# Patient Record
Sex: Female | Born: 1995 | Race: Black or African American | Hispanic: No | Marital: Single | Smoking: Never smoker
Health system: Southern US, Community
[De-identification: ages and names within clinical notes are randomized; demographics above are authoritative.]

---
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
## 2019-06-27 ENCOUNTER — Emergency Department (HOSPITAL_BASED_OUTPATIENT_CLINIC_OR_DEPARTMENT_OTHER)
Admission: EM | Admit: 2019-06-27 | Discharge: 2019-06-27 | Disposition: A | Discharge status: Home / Self Care | Payer: Self-pay | Attending: Emergency Medicine | Admitting: Emergency Medicine

## 2019-06-27 ENCOUNTER — Encounter (HOSPITAL_BASED_OUTPATIENT_CLINIC_OR_DEPARTMENT_OTHER): Payer: Self-pay | Admitting: Emergency Medicine

## 2019-06-27 ENCOUNTER — Emergency Department (HOSPITAL_BASED_OUTPATIENT_CLINIC_OR_DEPARTMENT_OTHER): Payer: Self-pay

## 2019-06-27 ENCOUNTER — Other Ambulatory Visit: Payer: Self-pay

## 2019-06-27 DIAGNOSIS — N390 Urinary tract infection, site not specified: Secondary | ICD-10-CM | POA: Insufficient documentation

## 2019-06-27 DIAGNOSIS — U071 COVID-19: Secondary | ICD-10-CM | POA: Insufficient documentation

## 2019-06-27 DIAGNOSIS — R1012 Left upper quadrant pain: Secondary | ICD-10-CM | POA: Insufficient documentation

## 2019-06-27 DIAGNOSIS — R109 Unspecified abdominal pain: Secondary | ICD-10-CM

## 2019-06-27 LAB — URINALYSIS, ROUTINE W REFLEX MICROSCOPIC
Bilirubin Urine: NEGATIVE
Glucose, UA: NEGATIVE mg/dL
Ketones, ur: 15 mg/dL — AB
Nitrite: NEGATIVE
Protein, ur: NEGATIVE mg/dL
Specific Gravity, Urine: >1.03 — ABNORMAL HIGH (ref 1.005–1.030)
pH: 6 (ref 5.0–8.0)

## 2019-06-27 LAB — CBC WITH DIFFERENTIAL/PLATELET
Abs Immature Granulocytes: 0.01 10*3/uL (ref 0.00–0.07)
Basophils Absolute: 0 10*3/uL (ref 0.0–0.1)
Basophils Relative: 0 %
Eosinophils Absolute: 0 10*3/uL (ref 0.0–0.5)
Eosinophils Relative: 0 %
HCT: 45.3 % (ref 36.0–46.0)
Hemoglobin: 13.8 g/dL (ref 12.0–15.0)
Immature Granulocytes: 0 %
Lymphocytes Relative: 31 %
Lymphs Abs: 2.2 10*3/uL (ref 0.7–4.0)
MCH: 22.4 pg — ABNORMAL LOW (ref 26.0–34.0)
MCHC: 30.5 g/dL (ref 30.0–36.0)
MCV: 73.5 fL — ABNORMAL LOW (ref 80.0–100.0)
Monocytes Absolute: 0.5 10*3/uL (ref 0.1–1.0)
Monocytes Relative: 6 %
Neutro Abs: 4.4 10*3/uL (ref 1.7–7.7)
Neutrophils Relative %: 63 %
Platelets: 307 10*3/uL (ref 150–400)
RBC: 6.16 MIL/uL — ABNORMAL HIGH (ref 3.87–5.11)
RDW: 14.6 % (ref 11.5–15.5)
WBC: 7 10*3/uL (ref 4.0–10.5)
nRBC: 0 % (ref 0.0–0.2)

## 2019-06-27 LAB — URINALYSIS, MICROSCOPIC (REFLEX)

## 2019-06-27 LAB — COMPREHENSIVE METABOLIC PANEL
ALT: 11 U/L (ref 0–44)
AST: 12 U/L — ABNORMAL LOW (ref 15–41)
Albumin: 3.8 g/dL (ref 3.5–5.0)
Alkaline Phosphatase: 63 U/L (ref 38–126)
Anion gap: 9 (ref 5–15)
BUN: 11 mg/dL (ref 6–20)
CO2: 25 mmol/L (ref 22–32)
Calcium: 9.1 mg/dL (ref 8.9–10.3)
Chloride: 105 mmol/L (ref 98–111)
Creatinine, Ser: 0.78 mg/dL (ref 0.44–1.00)
GFR calc Af Amer: >60 mL/min (ref >60)
GFR calc non Af Amer: >60 mL/min (ref >60)
Glucose, Bld: 110 mg/dL — ABNORMAL HIGH (ref 70–99)
Potassium: 3.6 mmol/L (ref 3.5–5.1)
Sodium: 139 mmol/L (ref 135–145)
Total Bilirubin: 0.3 mg/dL (ref 0.3–1.2)
Total Protein: 7.9 g/dL (ref 6.5–8.1)

## 2019-06-27 LAB — PREGNANCY, URINE: Preg Test, Ur: NEGATIVE

## 2019-06-27 MED ORDER — NITROFURANTOIN MONOHYD MACRO 100 MG PO CAPS
100.0000 mg | ORAL_CAPSULE | Freq: Once | ORAL | Status: AC
  Administered 2019-06-27: 100 mg via ORAL
  Filled 2019-06-27: qty 1

## 2019-06-27 MED ORDER — ONDANSETRON 8 MG PO TBDP
8.0000 mg | ORAL_TABLET | Freq: Once | ORAL | Status: AC
  Administered 2019-06-27: 8 mg via ORAL
  Filled 2019-06-27: qty 1

## 2019-06-27 MED ORDER — LIDOCAINE VISCOUS HCL 2 % MT SOLN
15.0000 mL | Freq: Once | OROMUCOSAL | Status: AC
  Administered 2019-06-27: 03:00:00 15 mL via ORAL
  Filled 2019-06-27: qty 15

## 2019-06-27 MED ORDER — ALUM & MAG HYDROXIDE-SIMETH 200-200-20 MG/5ML PO SUSP
30.0000 mL | Freq: Once | ORAL | Status: AC
  Administered 2019-06-27: 30 mL via ORAL
  Filled 2019-06-27: qty 30

## 2019-06-27 MED ORDER — ACETAMINOPHEN 500 MG PO TABS
1000.0000 mg | ORAL_TABLET | Freq: Once | ORAL | Status: AC
  Administered 2019-06-27: 03:00:00 1000 mg via ORAL
  Filled 2019-06-27: qty 2

## 2019-06-27 MED ORDER — NITROFURANTOIN MONOHYD MACRO 100 MG PO CAPS: 100.0000 mg | ORAL_CAPSULE | Freq: Two times a day (BID) | ORAL | 0 refills | Status: AC

## 2019-06-27 NOTE — Discharge Instructions (Addendum)
Person Under Monitoring Name: Jenna Hall  Location: [address] [place] [address]   Infection Prevention Recommendations for Individuals Confirmed to have, or Being Evaluated for, 2019 Novel Coronavirus (COVID-19) Infection Who Receive Care at Home  Individuals who are confirmed to have, or are being evaluated for, COVID-19 should follow the prevention steps below until a healthcare provider or local or state health department says they can return to normal activities.  Stay home except to get medical care You should restrict activities outside your home, except for getting medical care. Do not go to work, school, or public areas, and do not use public transportation or taxis.  Call ahead before visiting your doctor Before your medical appointment, call the healthcare provider and tell them that you have, or are being evaluated for, COVID-19 infection. This will help the healthcare providers office take steps to keep other people from getting infected. Ask your healthcare provider to call the local or state health department.  Monitor your symptoms Seek prompt medical attention if your illness is worsening (e.g., difficulty breathing). Before going to your medical appointment, call the healthcare provider and tell them that you have, or are being evaluated for, COVID-19 infection. Ask your healthcare provider to call the local or state health department.  Wear a facemask You should wear a facemask that covers your nose and mouth when you are in the same room with other people and when you visit a healthcare provider. People who live with or visit you should also wear a facemask while they are in the same room with you.  Separate yourself from other people in your home As much as possible, you should stay in a different room from other people in your home. Also, you should use a separate bathroom, if available.  Avoid sharing household items You  should not share dishes, drinking glasses, cups, eating utensils, towels, bedding, or other items with other people in your home. After using these items, you should wash them thoroughly with soap and water.  Cover your coughs and sneezes Cover your mouth and nose with a tissue when you cough or sneeze, or you can cough or sneeze into your sleeve. Throw used tissues in a lined trash can, and immediately wash your hands with soap and water for at least 20 seconds or use an alcohol-based hand rub.  Wash your [facility] your hands often and thoroughly with soap and water for at least 20 seconds. You can use an alcohol-based hand sanitizer if soap and water are not available and if your hands are not visibly dirty. Avoid touching your eyes, nose, and mouth with unwashed hands.   Prevention Steps for Caregivers and Household Members of Individuals Confirmed to have, or Being Evaluated for, COVID-19 Infection Being Cared for in the Home  If you live with, or provide care at home for, a person confirmed to have, or being evaluated for, COVID-19 infection please follow these guidelines to prevent infection:  Follow healthcare providers instructions Make sure that you understand and can help the patient follow any healthcare provider instructions for all care.  Provide for the patients basic needs You should help the patient with basic needs in the home and provide support for getting groceries, prescriptions, and other personal needs.  Monitor the patients symptoms If they are getting sicker, call his or her medical provider and tell them that the patient has, or is being evaluated for, COVID-19 infection. This will help the healthcare  providers office take steps to keep other people from getting infected. Ask the healthcare provider to call the local or state health department.  Limit the number of people who have contact with the patient If possible, have only one caregiver for the  patient. Other household members should stay in another home or place of residence. If this is not possible, they should stay in another room, or be separated from the patient as much as possible. Use a separate bathroom, if available. Restrict visitors who do not have an essential need to be in the home.  Keep older adults, very young children, and other sick people away from the patient Keep older adults, very young children, and those who have compromised immune systems or chronic health conditions away from the patient. This includes people with chronic heart, lung, or kidney conditions, diabetes, and cancer.  Ensure good ventilation Make sure that shared spaces in the home have good air flow, such as from an air conditioner or an opened window, weather permitting.  Wash your hands often Wash your hands often and thoroughly with soap and water for at least 20 seconds. You can use an alcohol based hand sanitizer if soap and water are not available and if your hands are not visibly dirty. Avoid touching your eyes, nose, and mouth with unwashed hands. Use disposable paper towels to dry your hands. If not available, use dedicated cloth towels and replace them when they become wet.  Wear a facemask and gloves Wear a disposable facemask at all times in the room and gloves when you touch or have contact with the patients blood, body fluids, and/or secretions or excretions, such as sweat, saliva, sputum, nasal mucus, vomit, urine, or feces.  Ensure the mask fits over your nose and mouth tightly, and do not touch it during use. Throw out disposable facemasks and gloves after using them. Do not reuse. Wash your hands immediately after removing your facemask and gloves. If your personal clothing becomes contaminated, carefully remove clothing and launder. Wash your hands after handling contaminated clothing. Place all used disposable facemasks, gloves, and other waste in a lined container before  disposing them with other household waste. Remove gloves and wash your hands immediately after handling these items.  Do not share dishes, glasses, or other household items with the patient Avoid sharing household items. You should not share dishes, drinking glasses, cups, eating utensils, towels, bedding, or other items with a patient who is confirmed to have, or being evaluated for, COVID-19 infection. After the person uses these items, you should wash them thoroughly with soap and water.  Wash laundry thoroughly Immediately remove and wash clothes or bedding that have blood, body fluids, and/or secretions or excretions, such as sweat, saliva, sputum, nasal mucus, vomit, urine, or feces, on them. Wear gloves when handling laundry from the patient. Read and follow directions on labels of laundry or clothing items and detergent. In general, wash and dry with the warmest temperatures recommended on the label.  Clean all areas the individual has used often Clean all touchable surfaces, such as counters, tabletops, doorknobs, bathroom fixtures, toilets, phones, keyboards, tablets, and bedside tables, every day. Also, clean any surfaces that may have blood, body fluids, and/or secretions or excretions on them. Wear gloves when cleaning surfaces the patient has come in contact with. Use a diluted bleach solution (e.g., dilute bleach with 1 part bleach and 10 parts water) or a household disinfectant with a label that says EPA-registered for coronaviruses. To make  a bleach solution at home, add 1 tablespoon of bleach to 1 quart (4 cups) of water. For a larger supply, add  cup of bleach to 1 gallon (16 cups) of water. Read labels of cleaning products and follow recommendations provided on product labels. Labels contain instructions for safe and effective use of the cleaning product including precautions you should take when applying the product, such as wearing gloves or eye protection and making sure you  have good ventilation during use of the product. Remove gloves and wash hands immediately after cleaning.  Monitor yourself for signs and symptoms of illness Caregivers and household members are considered close contacts, should monitor their health, and will be asked to limit movement outside of the home to the extent possible. Follow the monitoring steps for close contacts listed on the symptom monitoring form.   ? If you have additional questions, contact your local health department or call the epidemiologist on call at [phone] (available 24/7). ? This guidance is subject to change. For the most up-to-date guidance from [facility], please refer to their website: [email or web address]

## 2019-06-27 NOTE — ED Provider Notes (Addendum)
[facility] EMERGENCY DEPARTMENT Provider Note   CSN: [id number] Arrival date & time: 06/27/19  0053     History   Chief Complaint Chief Complaint  Patient presents with  . Abdominal Pain    HPI Jenna Hall is a 23 y.o. female.     The history is provided by the patient.  Abdominal Pain Pain location:  LUQ and RUQ Pain quality: aching   Pain radiates to:  Does not radiate Pain severity:  Moderate Onset quality:  Gradual Duration:  1 week Timing:  Constant Progression:  Unchanged Chronicity:  New Context: sick contacts   Context comment:  Has been coughing as she has covid and has had the pain since being diagnosed Relieved by:  Nothing Worsened by:  Nothing Ineffective treatments:  None tried Associated symptoms: cough   Associated symptoms: no anorexia, no belching, no chest pain, no chills, no constipation, no diarrhea, no dysuria, no fatigue, no fever, no flatus, no hematemesis, no hematochezia, no hematuria, no melena, no nausea, no shortness of breath, no sore throat, no vaginal bleeding, no vaginal discharge and no vomiting   Risk factors: not pregnant and no recent hospitalization   Patient diagnosed with covid a week ago and has been having upper abdominal pain since that time. Is somewhat positional. No f/c/r. No n/v/d.  No urinary symptoms.  No anosmia.  Is eating and drinking normally.    History reviewed. No pertinent past medical history.  There are no active problems to display for this patient.   History reviewed. No pertinent surgical history.   OB History   No obstetric history on file.      Home Medications    Prior to Admission medications   Medication Sig Start Date End Date Taking? Authorizing Provider  Cetirizine HCl (ZYRTEC ALLERGY) 10 MG CAPS Zyrtec    [provider]    Family History No family history on file.  Social History Social History   Tobacco Use  . Smoking status: Never Smoker  . Smokeless  tobacco: Never Used  Substance Use Topics  . Alcohol use: Yes  . Drug use: Never     Allergies   Patient has no known allergies.   Review of Systems Review of Systems  Constitutional: Negative for chills, fatigue and fever.  HENT: Negative for sore throat.   Eyes: Negative for visual disturbance.  Respiratory: Positive for cough. Negative for chest tightness, shortness of breath and wheezing.   Cardiovascular: Negative for chest pain, palpitations and leg swelling.  Gastrointestinal: Positive for abdominal pain. Negative for abdominal distention, anorexia, blood in stool, constipation, diarrhea, flatus, hematemesis, hematochezia, melena, nausea and vomiting.  Genitourinary: Negative for dysuria, flank pain, hematuria, vaginal bleeding and vaginal discharge.  Musculoskeletal: Negative for arthralgias.  Skin: Negative for rash.  Neurological: Negative for weakness and numbness.  Psychiatric/Behavioral: Negative for agitation.  All other systems reviewed and are negative.    Physical Exam Updated Vital Signs BP (!) 130/98   Pulse (!) 101   Temp 98.3 F (36.8 C) (Oral)   Resp 16   Ht 5\' 4"  (1.626 m)   Wt 131.5 kg   LMP 06/21/2019 (Exact Date)   SpO2 99%   BMI 49.78 kg/m   Physical Exam Vitals signs and nursing note reviewed.  Constitutional:      General: She is not in acute distress.    Appearance: She is obese. She is not ill-appearing.  HENT:     Head: Normocephalic and atraumatic.  Nose: Nose normal.  Eyes:     Conjunctiva/sclera: Conjunctivae normal.     Pupils: Pupils are equal, round, and reactive to light.  Neck:     Musculoskeletal: Normal range of motion and neck supple.  Cardiovascular:     Rate and Rhythm: Normal rate and regular rhythm.     Pulses: Normal pulses.     Heart sounds: Normal heart sounds.  Pulmonary:     Effort: Pulmonary effort is normal. No respiratory distress.     Breath sounds: Normal breath sounds. No wheezing or rales.   Abdominal:     General: Abdomen is flat. Bowel sounds are normal. There is no distension.     Palpations: Abdomen is soft.     Tenderness: There is no abdominal tenderness. There is no guarding or rebound. Negative signs include Murphy's sign, Rovsing's sign and McBurney's sign.     Hernia: No hernia is present.  Musculoskeletal: Normal range of motion.        General: No tenderness.     Right lower leg: No edema.     Left lower leg: No edema.  Skin:    General: Skin is warm and dry.     Capillary Refill: Capillary refill takes less than 2 seconds.  Neurological:     General: No focal deficit present.     Mental Status: She is alert and oriented to person, place, and time.  Psychiatric:        Mood and Affect: Mood normal.        Behavior: Behavior normal.      ED Treatments / Results  Labs (all labs ordered are listed, but only abnormal results are displayed) Results for orders placed or performed during the hospital encounter of 06/27/19  CBC with Differential/Platelet  Result Value Ref Range   WBC 7.0 4.0 - 10.5 K/uL   RBC 6.16 (H) 3.87 - 5.11 MIL/uL   Hemoglobin 13.8 12.0 - 15.0 g/dL   HCT 45.3 36.0 - 46.0 %   MCV 73.5 (L) 80.0 - 100.0 fL   MCH 22.4 (L) 26.0 - 34.0 pg   MCHC 30.5 30.0 - 36.0 g/dL   RDW 14.6 11.5 - 15.5 %   Platelets 307 150 - 400 K/uL   nRBC 0.0 0.0 - 0.2 %   Neutrophils Relative % 63 %   Neutro Abs 4.4 1.7 - 7.7 K/uL   Lymphocytes Relative 31 %   Lymphs Abs 2.2 0.7 - 4.0 K/uL   Monocytes Relative 6 %   Monocytes Absolute 0.5 0.1 - 1.0 K/uL   Eosinophils Relative 0 %   Eosinophils Absolute 0.0 0.0 - 0.5 K/uL   Basophils Relative 0 %   Basophils Absolute 0.0 0.0 - 0.1 K/uL   Immature Granulocytes 0 %   Abs Immature Granulocytes 0.01 0.00 - 0.07 K/uL  Comprehensive metabolic panel  Result Value Ref Range   Sodium 139 135 - 145 mmol/L   Potassium 3.6 3.5 - 5.1 mmol/L   Chloride 105 98 - 111 mmol/L   CO2 25 22 - 32 mmol/L   Glucose, Bld 110  (H) 70 - 99 mg/dL   BUN 11 6 - 20 mg/dL   Creatinine, Ser 0.78 0.44 - 1.00 mg/dL   Calcium 9.1 8.9 - 10.3 mg/dL   Total Protein 7.9 6.5 - 8.1 g/dL   Albumin 3.8 3.5 - 5.0 g/dL   AST 12 (L) 15 - 41 U/L   ALT 11 0 - 44 U/L   Alkaline Phosphatase  63 38 - 126 U/L   Total Bilirubin 0.3 0.3 - 1.2 mg/dL   GFR calc non Af Amer >60 >60 mL/min   GFR calc Af Amer >60 >60 mL/min   Anion gap 9 5 - 15  Pregnancy, urine  Result Value Ref Range   Preg Test, Ur NEGATIVE NEGATIVE   No results found.  Radiology No results found.  Procedures Procedures (including critical care time)  Medications Ordered in ED Medications  ondansetron (ZOFRAN-ODT) disintegrating tablet 8 mg (has no administration in time range)  alum & mag hydroxide-simeth (MAALOX/MYLANTA) 200-200-20 MG/5ML suspension 30 mL (has no administration in time range)    And  lidocaine (XYLOCAINE) 2 % viscous mouth solution 15 mL (has no administration in time range)  acetaminophen (TYLENOL) tablet 1,000 mg (has no administration in time range)    Patient is well appearing. Pain is likely MSK in nature and secondary to coughing.  No LFT changes to suggest biliary colic, no murphy's sign.  No LFT abnormalities as can be seen with covid.  Exam and vitals are benign and reassuring.  Have advised healthy diet, Tylenol for pain and will start pepcid.  Recheck with your doctor in several days.  Strict abdominal pain return precautions given.   AVABELLA STRASSER was evaluated in Emergency Department on 06/27/2019 for the symptoms described in the history of present illness. She was evaluated in the context of the global COVID-19 pandemic, which necessitated consideration that the patient might be at risk for infection with the SARS-CoV-2 virus that causes COVID-19. Institutional protocols and algorithms that pertain to the evaluation of patients at risk for COVID-19 are in a state of rapid change based on information released by regulatory bodies  including the CDC and federal and state organizations. These policies and algorithms were followed during the patient's care in the ED.  Isolation agreement provided.  Final Clinical Impressions(s) / ED Diagnoses   Return for intractable cough, coughing up blood,fevers >100.4 unrelieved by medication, shortness of breath, intractable vomiting, chest pain, shortness of breath, weakness,numbness, changes in speech, facial asymmetry,abdominal pain, passing out,Inability to tolerate liquids or food, cough, altered mental status or any concerns. No signs of systemic illness or infection. The patient is nontoxic-appearing on exam and vital signs are within normal limits.   I have reviewed the triage vital signs and the nursing notes. Pertinent labs &imaging results that were available during my care of the patient were reviewed by me and considered in my medical decision making (see chart for details).After history, exam, and medical workup I feel the patient has beenappropriately medically screened and is safe for discharge home. Pertinent diagnoses were discussed with the patient. Patient was given return precautions.   Mikayah Joy, MD 06/27/19 [id number]    Cy Blamer, MD 06/27/19 [id number]

## 2019-06-27 NOTE — ED Triage Notes (Signed)
Pt c/o upper abd pain x 1 week ago. Pt was dx with COVID last week. Pt states she has not ran fever since last week. Pt reports decreased cough.

## 2020-03-31 IMAGING — DX DG ABDOMEN ACUTE W/ 1V CHEST
4 series · 4 of 4 positions shown · non-contrast
Comparison: None.

CLINICAL DATA: L8T1T-1V positivity with upper abdominal pain,
initial encounter

EXAM:
DG ABDOMEN ACUTE W/ 1V CHEST

[abdomen kub]
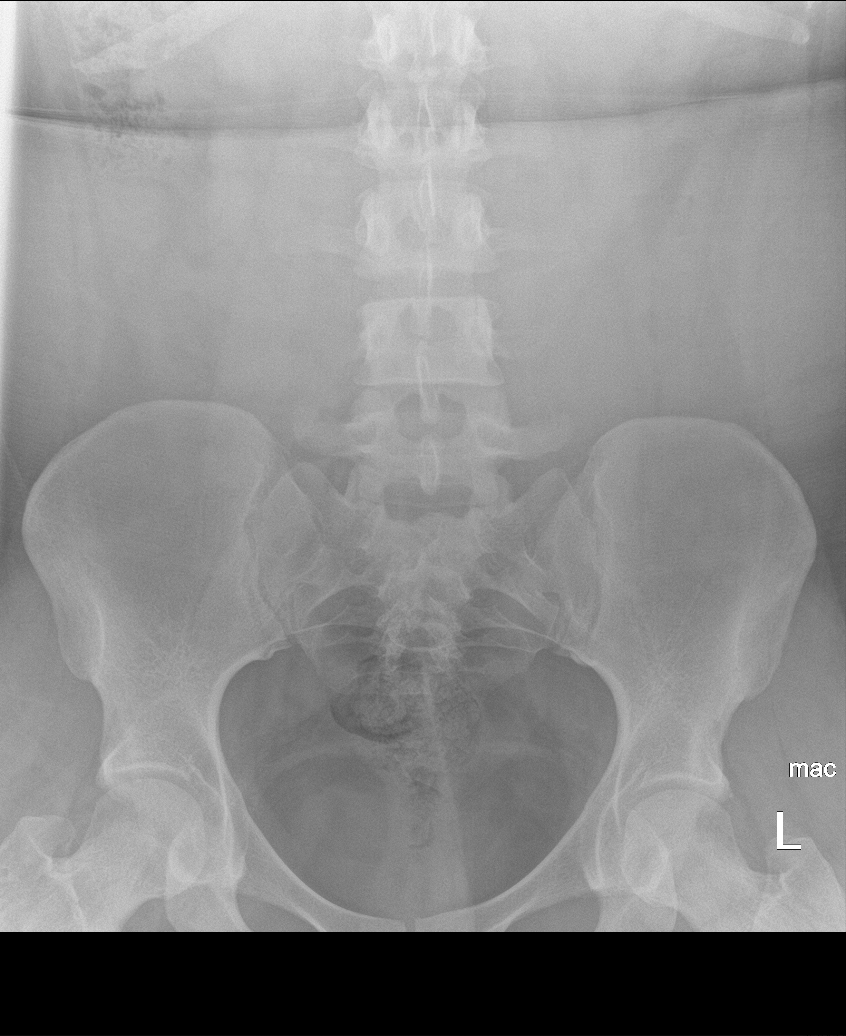

[abdomen erect (1 of 2)]
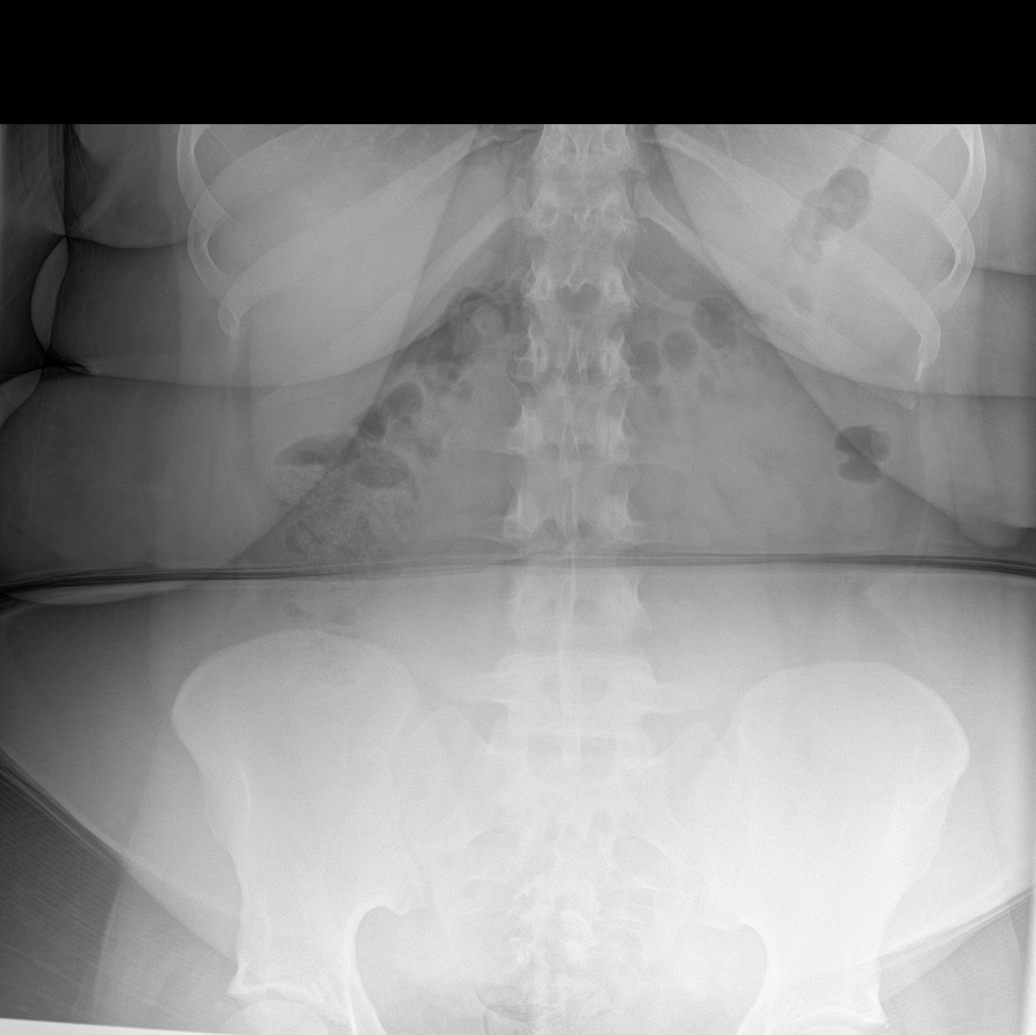

[chest pa]
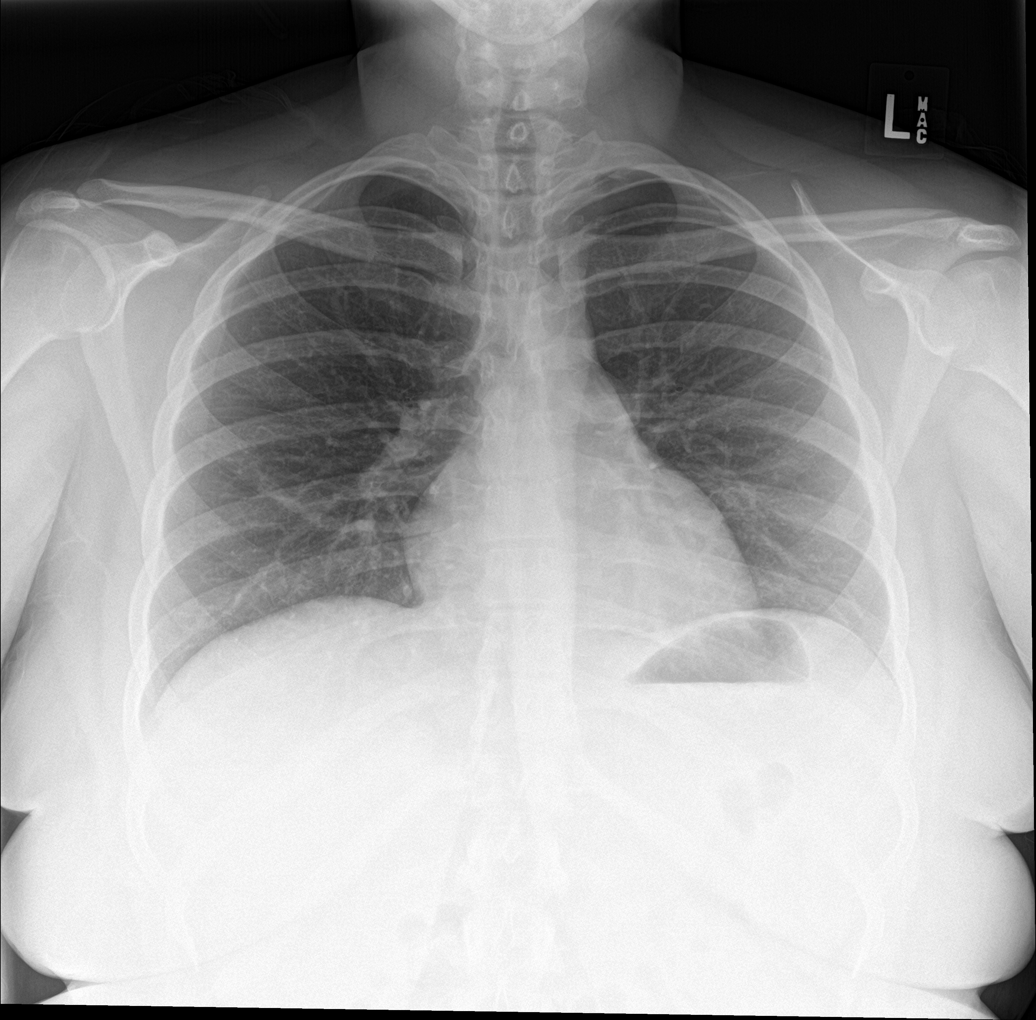

[abdomen erect (2 of 2)]
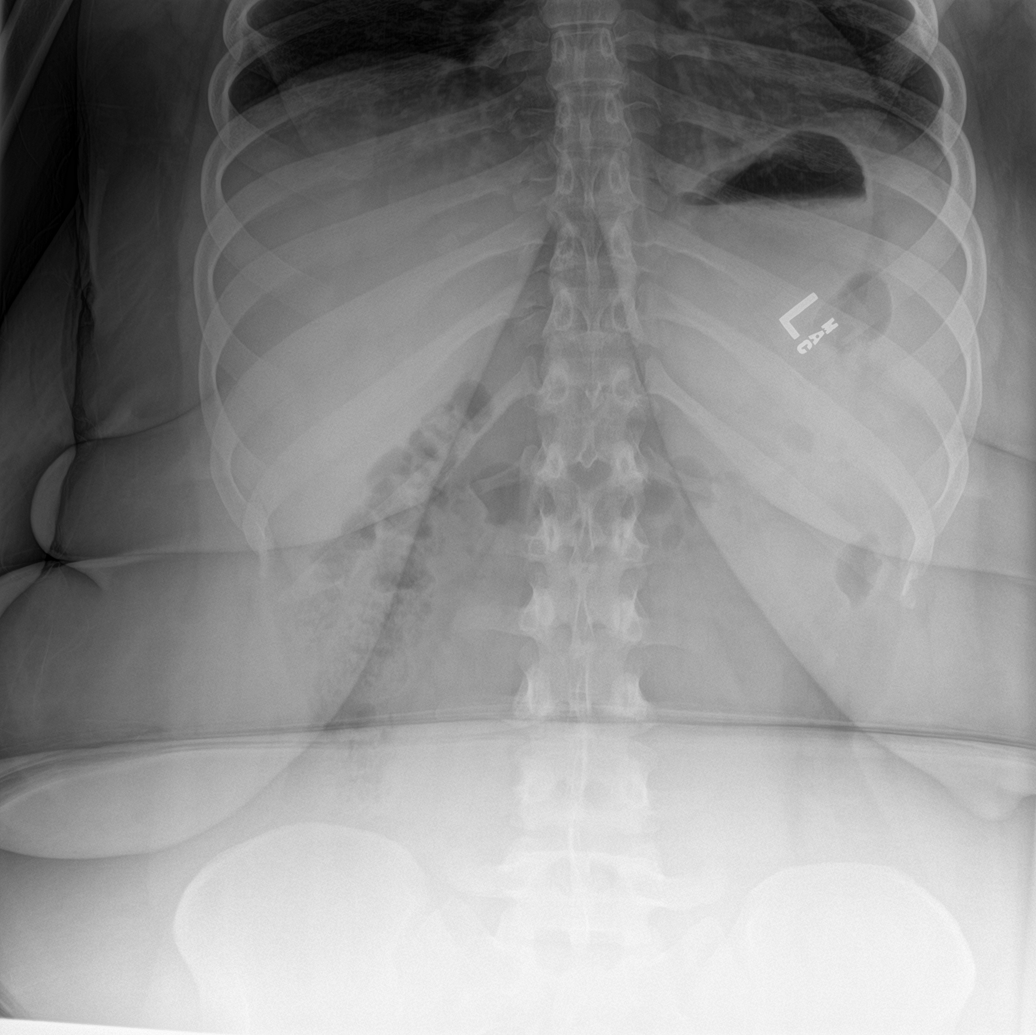

[4 of 4 positions shown; findings below may reference images not displayed]

FINDINGS: Cardiac shadows within normal limits. The lungs are well aerated
bilaterally. No focal infiltrate or sizable effusion is seen.

Scattered large and small bowel gas is noted. No free air is noted.
No bony abnormality is seen.
IMPRESSION: No acute abnormality in the chest and abdomen.
